# Patient Record
Sex: Female | Born: 1985
Health system: Southern US, Community
[De-identification: ages and names within clinical notes are randomized; demographics above are authoritative.]

## PROBLEM LIST (undated history)

## (undated) HISTORY — PX: CHOLECYSTECTOMY: SHX55

## (undated) HISTORY — PX: TONSILLECTOMY: SUR1361

## (undated) HISTORY — PX: DILATION AND CURETTAGE OF UTERUS: SHX78

---

## 2014-10-18 LAB — HM PAP SMEAR: HM Pap smear: NEGATIVE

## 2017-12-21 DIAGNOSIS — J029 Acute pharyngitis, unspecified: Secondary | ICD-10-CM | POA: Diagnosis not present

## 2017-12-21 DIAGNOSIS — J019 Acute sinusitis, unspecified: Secondary | ICD-10-CM | POA: Diagnosis not present

## 2017-12-26 ENCOUNTER — Encounter: Payer: Self-pay | Admitting: Emergency Medicine

## 2017-12-26 ENCOUNTER — Other Ambulatory Visit: Payer: Self-pay

## 2017-12-26 DIAGNOSIS — G933 Postviral fatigue syndrome: Secondary | ICD-10-CM | POA: Diagnosis not present

## 2017-12-26 DIAGNOSIS — J329 Chronic sinusitis, unspecified: Secondary | ICD-10-CM | POA: Diagnosis not present

## 2017-12-26 DIAGNOSIS — R0981 Nasal congestion: Secondary | ICD-10-CM | POA: Diagnosis not present

## 2017-12-26 DIAGNOSIS — B349 Viral infection, unspecified: Secondary | ICD-10-CM | POA: Insufficient documentation

## 2017-12-26 DIAGNOSIS — J029 Acute pharyngitis, unspecified: Secondary | ICD-10-CM | POA: Diagnosis not present

## 2017-12-26 DIAGNOSIS — R51 Headache: Secondary | ICD-10-CM | POA: Diagnosis not present

## 2017-12-26 MED ORDER — IBUPROFEN 800 MG PO TABS
800.0000 mg | ORAL_TABLET | Freq: Once | ORAL | Status: AC
  Administered 2017-12-26: 800 mg via ORAL

## 2017-12-26 MED ORDER — IBUPROFEN 800 MG PO TABS
ORAL_TABLET | ORAL | Status: AC
  Filled 2017-12-26: qty 1

## 2017-12-26 NOTE — ED Triage Notes (Signed)
Pt says she's been sick for 2 weeks with sinus pressure and sore throat; saw her MD and took steroid as prescribed; pt now c/o headache and pain to the back of her neck since Tuesday; took Doxycycline and antihistamine as prescribed; felt better by Friday but now all symptoms have returned; pt with hoarse voice and sinus congestion; talking in complete coherent sentences

## 2017-12-27 ENCOUNTER — Emergency Department
Admission: EM | Admit: 2017-12-27 | Discharge: 2017-12-27 | Disposition: A | Discharge status: Home / Self Care | Payer: BLUE CROSS/BLUE SHIELD | Attending: Emergency Medicine | Admitting: Emergency Medicine

## 2017-12-27 ENCOUNTER — Emergency Department: Payer: BLUE CROSS/BLUE SHIELD

## 2017-12-27 DIAGNOSIS — G933 Postviral fatigue syndrome: Secondary | ICD-10-CM

## 2017-12-27 DIAGNOSIS — G9331 Postviral fatigue syndrome: Secondary | ICD-10-CM

## 2017-12-27 DIAGNOSIS — J329 Chronic sinusitis, unspecified: Secondary | ICD-10-CM | POA: Diagnosis not present

## 2017-12-27 DIAGNOSIS — R0981 Nasal congestion: Secondary | ICD-10-CM

## 2017-12-27 MED ORDER — IPRATROPIUM BROMIDE 0.06 % NA SOLN: 2.0000 | Freq: Three times a day (TID) | NASAL | 0 refills | Status: DC

## 2017-12-27 MED ORDER — ACETAMINOPHEN 500 MG PO TABS
1000.0000 mg | ORAL_TABLET | Freq: Once | ORAL | Status: AC
  Administered 2017-12-27: 1000 mg via ORAL
  Filled 2017-12-27: qty 2

## 2017-12-27 MED ORDER — BENZONATATE 200 MG PO CAPS: 200.0000 mg | ORAL_CAPSULE | Freq: Four times a day (QID) | ORAL | 0 refills | Status: DC | PRN

## 2017-12-27 MED ORDER — OXYMETAZOLINE HCL 0.05 % NA SOLN
1.0000 | Freq: Once | NASAL | Status: AC
  Administered 2017-12-27: 1 via NASAL
  Filled 2017-12-27: qty 15

## 2017-12-27 MED ORDER — IBUPROFEN 600 MG PO TABS: 600.0000 mg | ORAL_TABLET | Freq: Once | ORAL | Status: DC

## 2017-12-27 NOTE — ED Notes (Signed)
Pt resting in bed; reports headache pain 3/10 after tylenol and Afrin; but when she coughs she still feels like her "head is going to explode";

## 2017-12-27 NOTE — ED Provider Notes (Signed)
[facility] Emergency Department Provider Note  ____________________________________________   First MD Initiated Contact with Patient 12/27/17 0128     (approximate)  I have reviewed the triage vital signs and the nursing notes.   HISTORY  Chief Complaint Headache and Nasal Congestion   HPI Becky KennedyBobbie Shen is a 32 y.o. female who self presents to the emergency department with roughly 2 weeks of sore throat, sinus congestion, and headache.  She initially went to her primary care who gave her several days of steroids which did not seem to help.  For the past several days she has been taking doxycycline and an unknown antihistamine with minimal relief.  She denies fevers or chills.  Her primary concern is the pressure in her sinuses.  It is worse in the morning improve throughout the day.  She has not been using any sinus rinses.  Her headache is nonradiating.  She does have some hoarse voice.  History reviewed. No pertinent past medical history.  There are no active problems to display for this patient.   Past Surgical History:  Procedure Laterality Date  . CESAREAN SECTION    . CHOLECYSTECTOMY    . DILATION AND CURETTAGE OF UTERUS    . TONSILLECTOMY      Prior to Admission medications   Medication Sig Start Date End Date Taking? Authorizing Provider  benzonatate (TESSALON) 200 MG capsule Take 1 capsule (200 mg total) by mouth every 6 (six) hours as needed for cough. 12/27/17 12/27/18  Merrily Brittleifenbark, Angeni Chaudhuri, MD  ipratropium (ATROVENT) 0.06 % nasal spray Place 2 sprays into the nose 3 (three) times daily. 12/27/17 12/27/18  Merrily Brittleifenbark, Altus Zaino, MD    Allergies Amoxicillin and Ceclor [cefaclor]  History reviewed. No pertinent family history.  Social History Social History   Tobacco Use  . Smoking status: Never Smoker  . Smokeless tobacco: Never Used  Substance Use Topics  . Alcohol use: Never    Frequency: Never  . Drug use: Never    Review of  Systems Constitutional: No fever/chills Eyes: No visual changes. ENT: Positive for sore throat. Cardiovascular: Denies chest pain. Respiratory: Denies shortness of breath. Gastrointestinal: No abdominal pain.  No nausea, no vomiting.  No diarrhea.  No constipation. Genitourinary: Negative for dysuria. Musculoskeletal: Negative for back pain. Skin: Negative for rash. Neurological: Positive for headache   ____________________________________________   PHYSICAL EXAM:  VITAL SIGNS: ED Triage Vitals  Enc Vitals Group     BP 12/26/17 2248 (!) 140/94     Pulse Rate 12/26/17 2248 (!) 121     Resp 12/26/17 2248 19     Temp 12/26/17 2248 99.9 F (37.7 C)     Temp Source 12/26/17 2248 Oral     SpO2 12/26/17 2248 96 %     Weight 12/26/17 2250 235 lb (106.6 kg)     Height 12/26/17 2250 5\' 5"  (1.651 m)     Head Circumference --      Peak Flow --      Pain Score 12/26/17 2250 9     Pain Loc --      Pain Edu? --      Excl. in GC? --     Constitutional: Alert and oriented x4 speaks with nasal voice appears somewhat uncomfortable.  Hoarse voice Eyes: PERRL EOMI. Head: Atraumatic. Nose: She does have sinus tenderness and some congestion noted Mouth/Throat: No trismus Neck: No stridor.  No meningismus Cardiovascular: Normal rate, regular rhythm. Grossly normal heart sounds.  Good peripheral circulation.  Respiratory: Normal respiratory effort.  No retractions. Lungs CTAB and moving good air Gastrointestinal: Soft nontender Musculoskeletal: No lower extremity edema   Neurologic:  Normal speech and language. No gross focal neurologic deficits are appreciated. Skin:  Skin is warm, dry and intact. No rash noted. Psychiatric: Mood and affect are normal. Speech and behavior are normal.    ____________________________________________   DIFFERENTIAL includes but not limited to  Sinus congestion, sinusitis, laryngitis, upper respiratory tract infection,  pneumonia ____________________________________________   LABS (all labs ordered are listed, but only abnormal results are displayed)  Labs Reviewed - No data to display   __________________________________________  EKG   ____________________________________________  RADIOLOGY  Chest x-ray reviewed by me with no infiltrate ____________________________________________   PROCEDURES  Procedure(s) performed: no  Procedures  Critical Care performed: no  Observation: no ____________________________________________   INITIAL IMPRESSION / ASSESSMENT AND PLAN / ED COURSE  Pertinent labs & imaging results that were available during my care of the patient were reviewed by me and considered in my medical decision making (see chart for details).  The patient arrives with sinus congestion and a hoarse voice.  She likely has a post viral syndrome along with increased congestion although given the duration of her symptoms chest x-ray obtained to evaluate for possible pneumonia which is fortunately negative.  I reviewed using sinus rinses with the patient.  She feels improved after Afrin.  Will discharge home with Tessalon Perles as well as Atrovent nasal spray.  Strict return precautions given to the patient verbalized understanding agreement plan.      ____________________________________________   FINAL CLINICAL IMPRESSION(S) / ED DIAGNOSES  Final diagnoses:  Sinus congestion  Post viral syndrome      NEW MEDICATIONS STARTED DURING THIS VISIT:  Discharge Medication List as of 12/27/2017  3:33 AM    START taking these medications   Details  benzonatate (TESSALON) 200 MG capsule Take 1 capsule (200 mg total) by mouth every 6 (six) hours as needed for cough., Starting Mon 12/27/2017, Until Tue 12/27/2018, Print    ipratropium (ATROVENT) 0.06 % nasal spray Place 2 sprays into the nose 3 (three) times daily., Starting Mon 12/27/2017, Until Tue 12/27/2018, Print          Note:  This document was prepared using Dragon voice recognition software and may include unintentional dictation errors.     Merrily Brittle, MD 12/29/17 2228

## 2017-12-27 NOTE — ED Notes (Signed)
Pt triaged by this nurse, see note; pt says Ibuprofen only eased her pain slightly; says she feels sweaty, like she broke a fever while waiting in the [facility]

## 2017-12-27 NOTE — Discharge Instructions (Signed)
Fortunately  today your chest x-ray was reassuring.  Please use a Nettie pot or sinus rinse every day to help with your congestion follow-up with primary care as needed.  Return to the emergency department for any concerns.  It was a pleasure to take care of you today, and thank you for coming to our emergency department.  If you have any questions or concerns before leaving please ask the nurse to grab me and I'm more than happy to go through your aftercare instructions again.  If you were prescribed any opioid pain medication today such as Norco, Vicodin, Percocet, morphine, hydrocodone, or oxycodone please make sure you do not drive when you are taking this medication as it can alter your ability to drive safely.  If you have any concerns once you are home that you are not improving or are in fact getting worse before you can make it to your follow-up appointment, please do not hesitate to call 911 and come back for further evaluation.  Merrily Brittle, MD  No results found for this or any previous visit. Dg Chest 2 View  Result Date: 12/27/2017 CLINICAL DATA:  Subacute onset of sinus pressure and sore throat. Headache and neck pain. EXAM: CHEST - 2 VIEW COMPARISON:  None. FINDINGS: The lungs are well-aerated and clear. There is no evidence of focal opacification, pleural effusion or pneumothorax. The heart is normal in size; the mediastinal contour is within normal limits. No acute osseous abnormalities are seen. Clips are noted within the right upper quadrant, reflecting prior cholecystectomy. IMPRESSION: No acute cardiopulmonary process seen. Electronically Signed   By: Roanna Raider M.D.   On: 12/27/2017 02:25

## 2018-01-25 DIAGNOSIS — L508 Other urticaria: Secondary | ICD-10-CM | POA: Diagnosis not present

## 2018-01-25 DIAGNOSIS — L509 Urticaria, unspecified: Secondary | ICD-10-CM | POA: Diagnosis not present

## 2018-01-25 DIAGNOSIS — Z6841 Body Mass Index (BMI) 40.0 and over, adult: Secondary | ICD-10-CM | POA: Diagnosis not present

## 2018-01-27 ENCOUNTER — Emergency Department
Admission: EM | Admit: 2018-01-27 | Discharge: 2018-01-28 | Disposition: A | Discharge status: Home / Self Care | Payer: BLUE CROSS/BLUE SHIELD | Attending: Emergency Medicine | Admitting: Emergency Medicine

## 2018-01-27 DIAGNOSIS — L500 Allergic urticaria: Secondary | ICD-10-CM | POA: Diagnosis not present

## 2018-01-27 DIAGNOSIS — L509 Urticaria, unspecified: Secondary | ICD-10-CM | POA: Insufficient documentation

## 2018-01-27 DIAGNOSIS — Z79899 Other long term (current) drug therapy: Secondary | ICD-10-CM | POA: Diagnosis not present

## 2018-01-27 LAB — BASIC METABOLIC PANEL
Anion gap: 8 (ref 5–15)
BUN: 14 mg/dL (ref 6–20)
CHLORIDE: 103 mmol/L (ref 101–111)
CO2: 28 mmol/L (ref 22–32)
Calcium: 9.3 mg/dL (ref 8.9–10.3)
Creatinine, Ser: 0.76 mg/dL (ref 0.44–1.00)
GFR calc non Af Amer: >60 mL/min (ref >60)
Glucose, Bld: 122 mg/dL — ABNORMAL HIGH (ref 65–99)
POTASSIUM: 3.9 mmol/L (ref 3.5–5.1)
Sodium: 139 mmol/L (ref 135–145)

## 2018-01-27 LAB — CBC WITH DIFFERENTIAL/PLATELET
Basophils Absolute: 0.1 10*3/uL (ref 0–0.1)
Basophils Relative: 1 %
EOS ABS: 0.2 10*3/uL (ref 0–0.7)
Eosinophils Relative: 2 %
HCT: 41 % (ref 35.0–47.0)
HEMOGLOBIN: 13.7 g/dL (ref 12.0–16.0)
LYMPHS ABS: 3.8 10*3/uL — AB (ref 1.0–3.6)
LYMPHS PCT: 34 %
MCH: 28.6 pg (ref 26.0–34.0)
MCHC: 33.5 g/dL (ref 32.0–36.0)
MCV: 85.2 fL (ref 80.0–100.0)
Monocytes Absolute: 0.6 10*3/uL (ref 0.2–0.9)
Monocytes Relative: 5 %
NEUTROS ABS: 6.6 10*3/uL — AB (ref 1.4–6.5)
NEUTROS PCT: 58 %
Platelets: 226 10*3/uL (ref 150–440)
RBC: 4.82 MIL/uL (ref 3.80–5.20)
RDW: 14.5 % (ref 11.5–14.5)
WBC: 11.3 10*3/uL — AB (ref 3.6–11.0)

## 2018-01-27 LAB — HEPATIC FUNCTION PANEL
ALK PHOS: 73 U/L (ref 38–126)
ALT: 16 U/L (ref 14–54)
AST: 22 U/L (ref 15–41)
Albumin: 3.9 g/dL (ref 3.5–5.0)
BILIRUBIN INDIRECT: 0.6 mg/dL (ref 0.3–0.9)
Bilirubin, Direct: 0.1 mg/dL (ref 0.1–0.5)
TOTAL PROTEIN: 7.2 g/dL (ref 6.5–8.1)
Total Bilirubin: 0.7 mg/dL (ref 0.3–1.2)

## 2018-01-27 LAB — SEDIMENTATION RATE: Sed Rate: 29 mm/hr — ABNORMAL HIGH (ref 0–20)

## 2018-01-27 MED ORDER — DIPHENHYDRAMINE HCL 50 MG/ML IJ SOLN
25.0000 mg | Freq: Once | INTRAMUSCULAR | Status: AC
  Administered 2018-01-27: 25 mg via INTRAVENOUS
  Filled 2018-01-27: qty 1

## 2018-01-27 MED ORDER — FAMOTIDINE IN NACL 20-0.9 MG/50ML-% IV SOLN
20.0000 mg | Freq: Once | INTRAVENOUS | Status: AC
  Administered 2018-01-27: 20 mg via INTRAVENOUS
  Filled 2018-01-27: qty 50

## 2018-01-27 MED ORDER — METHYLPREDNISOLONE SODIUM SUCC 125 MG IJ SOLR
125.0000 mg | Freq: Once | INTRAMUSCULAR | Status: AC
  Administered 2018-01-27: 125 mg via INTRAVENOUS
  Filled 2018-01-27: qty 2

## 2018-01-27 NOTE — ED Triage Notes (Signed)
Patient c/o oral swelling and throat tightness. Patient reports intermittent hives since April.

## 2018-01-28 LAB — URINALYSIS, COMPLETE (UACMP) WITH MICROSCOPIC
Bilirubin Urine: NEGATIVE
Glucose, UA: NEGATIVE mg/dL
Hgb urine dipstick: NEGATIVE
KETONES UR: NEGATIVE mg/dL
Leukocytes, UA: NEGATIVE
Nitrite: NEGATIVE
PROTEIN: NEGATIVE mg/dL
Specific Gravity, Urine: 1.011 (ref 1.005–1.030)
pH: 7 (ref 5.0–8.0)

## 2018-01-28 MED ORDER — DIPHENHYDRAMINE HCL 25 MG PO TABS: 25.0000 mg | ORAL_TABLET | Freq: Four times a day (QID) | ORAL | 0 refills | Status: DC | PRN

## 2018-01-28 MED ORDER — PREDNISONE 10 MG PO TABS: ORAL_TABLET | ORAL | 0 refills | Status: AC

## 2018-01-28 MED ORDER — RANITIDINE HCL 150 MG PO CAPS: 150.0000 mg | ORAL_CAPSULE | Freq: Two times a day (BID) | ORAL | 0 refills | Status: DC

## 2018-01-28 NOTE — Discharge Instructions (Addendum)
Your blood tests today were unremarkable. Keep your appointments with primary care and the allergy specialist.

## 2018-01-28 NOTE — ED Provider Notes (Signed)
[facility] Emergency Department Provider Note  ____________________________________________  Time seen: Approximately 12:04 AM  I have reviewed the triage vital signs and the nursing notes.   HISTORY  Chief Complaint Allergic Reaction    HPI Becky Welch is a 32 y.o. female Who complains of chronic urticaria for the past 8 weeks. She seemed primary care for this, it gets better with steroids and antihistamines, but worsens on a daily basis in between dosing. Worsens when she is in between courses of steroids. No fevers or chills. No tick exposures. No headaches or neck pain or stiffness or body aches. No difficulty breathing or throat swelling or vomiting.  Has follow-up with primary care and immunology in about 2 weeks      History reviewed. No pertinent past medical history.   There are no active problems to display for this patient.    Past Surgical History:  Procedure Laterality Date  . CESAREAN SECTION    . CHOLECYSTECTOMY    . DILATION AND CURETTAGE OF UTERUS    . TONSILLECTOMY       Prior to Admission medications   Medication Sig Start Date End Date Taking? Authorizing Provider  benzonatate (TESSALON) 200 MG capsule Take 1 capsule (200 mg total) by mouth every 6 (six) hours as needed for cough. 12/27/17 12/27/18  Merrily Brittle, MD  diphenhydrAMINE (BENADRYL) 25 MG tablet Take 1 tablet (25 mg total) by mouth every 6 (six) hours as needed. 01/28/18   Sharman Cheek, MD  ipratropium (ATROVENT) 0.06 % nasal spray Place 2 sprays into the nose 3 (three) times daily. 12/27/17 12/27/18  Merrily Brittle, MD  predniSONE (DELTASONE) 10 MG tablet Take 6 tablets (60 mg total) by mouth daily for 3 days, THEN 4 tablets (40 mg total) daily for 3 days, THEN 2 tablets (20 mg total) daily for 3 days, THEN 1 tablet (10 mg total) daily for 3 days. 01/28/18 02/09/18  Sharman Cheek, MD  ranitidine (ZANTAC) 150 MG capsule Take 1 capsule (150 mg total) by mouth 2  (two) times daily. 01/28/18   Sharman Cheek, MD     Allergies Amoxicillin and Ceclor [cefaclor]   No family history on file.  Social History Social History   Tobacco Use  . Smoking status: Never Smoker  . Smokeless tobacco: Never Used  Substance Use Topics  . Alcohol use: Never    Frequency: Never  . Drug use: Never    Review of Systems  Constitutional:   No fever or chills.  ENT:   No sore throat. No rhinorrhea. Cardiovascular:   No chest pain or syncope. Respiratory:   No dyspnea or cough. Gastrointestinal:   Negative for abdominal pain, vomiting and diarrhea.  Musculoskeletal:   Negative for focal pain or swelling All other systems reviewed and are negative except as documented above in ROS and HPI.  ____________________________________________   PHYSICAL EXAM:  VITAL SIGNS: ED Triage Vitals  Enc Vitals Group     BP 01/27/18 1959 (!) 155/109     Pulse Rate 01/27/18 1959 (!) 102     Resp 01/27/18 1959 (!) 22     Temp 01/27/18 1959 98.1 F (36.7 C)     Temp Source 01/27/18 1959 Oral     SpO2 01/27/18 1959 97 %     Weight 01/27/18 2000 230 lb (104.3 kg)     Height --      Head Circumference --      Peak Flow --      Pain  Score 01/27/18 2000 2     Pain Loc --      Pain Edu? --      Excl. in GC? --     Vital signs reviewed, nursing assessments reviewed.   Constitutional:   Alert and oriented. Well appearing and in no distress. Eyes:   Conjunctivae are normal. EOMI. PERRL. ENT      Head:   Normocephalic and atraumatic.      Nose:   No congestion/rhinnorhea.       Mouth/Throat:   MMM, no pharyngeal erythema. No peritonsillar mass.       Neck:   No meningismus. Full ROM. Hematological/Lymphatic/Immunilogical:   No cervical lymphadenopathy. Cardiovascular:   RRR. Symmetric bilateral radial and DP pulses.  No murmurs.  Respiratory:   Normal respiratory effort without tachypnea/retractions. Breath sounds are clear and equal bilaterally. No  wheezes/rales/rhonchi.no inducible wheezing with FEV1 maneuver Gastrointestinal:   Soft and nontender. Non distended. There is no CVA tenderness.  No rebound, rigidity, or guarding.  Musculoskeletal:   Normal range of motion in all extremities. No joint effusions.  No lower extremity tenderness.  No edema. Neurologic:   Normal speech and language.  Motor grossly intact. No acute focal neurologic deficits are appreciated.  Skin:    Skin is warm, dry and intact. No rash noted.  No petechiae, purpura, or bullae.  ____________________________________________    LABS (pertinent positives/negatives) (all labs ordered are listed, but only abnormal results are displayed) Labs Reviewed  BASIC METABOLIC PANEL - Abnormal; Notable for the following components:      Result Value   Glucose, Bld 122 (*)    All other components within normal limits  CBC WITH DIFFERENTIAL/PLATELET - Abnormal; Notable for the following components:   WBC 11.3 (*)    Neutro Abs 6.6 (*)    Lymphs Abs 3.8 (*)    All other components within normal limits  SEDIMENTATION RATE - Abnormal; Notable for the following components:   Sed Rate 29 (*)    All other components within normal limits  HEPATIC FUNCTION PANEL  URINALYSIS, COMPLETE (UACMP) WITH MICROSCOPIC   ____________________________________________   EKG    ____________________________________________    RADIOLOGY  No results found.  ____________________________________________   PROCEDURES Procedures  ____________________________________________   CLINICAL IMPRESSION / ASSESSMENT AND PLAN / ED COURSE  Pertinent labs & imaging results that were available during my care of the patient were reviewed by me and considered in my medical decision making (see chart for details).    patient presents with chronic urticaria. No identifiable cause. Artery completed courses of azithromycin and doxycycline, and no known tick exposure or other chemical  exposure. She is calm and comfortable, not in anaphylaxis. Treat with steroids and antihistamines, continue follow-up as scheduled. Check labs for signs of inflammatory state, polycythemia, uremia  Clinical Course as of Jan 29 3  Thu Jan 27, 2018  2358 labs reassuring, no evidence of significant underlying pathology as a cause of the urticaria. We'll continue to treat symptomatically until she can follow up with allergy and immunology as scheduled in the next 2 weeks.   [PS]    Clinical Course User Index [PS] Sharman Cheek, MD     ____________________________________________   FINAL CLINICAL IMPRESSION(S) / ED DIAGNOSES    Final diagnoses:  Urticaria     ED Discharge Orders        Ordered    diphenhydrAMINE (BENADRYL) 25 MG tablet  Every 6 hours PRN     01/28/18 0003  predniSONE (DELTASONE) 10 MG tablet     01/28/18 0003    ranitidine (ZANTAC) 150 MG capsule  2 times daily     01/28/18 0003      Portions of this note were generated with dragon dictation software. Dictation errors may occur despite best attempts at proofreading.    Sharman Cheek, MD 01/28/18 Rich Fuchs

## 2018-02-02 DIAGNOSIS — L509 Urticaria, unspecified: Secondary | ICD-10-CM | POA: Insufficient documentation

## 2018-02-10 DIAGNOSIS — L501 Idiopathic urticaria: Secondary | ICD-10-CM | POA: Insufficient documentation

## 2018-02-10 DIAGNOSIS — J301 Allergic rhinitis due to pollen: Secondary | ICD-10-CM | POA: Diagnosis not present

## 2018-02-10 DIAGNOSIS — L508 Other urticaria: Secondary | ICD-10-CM | POA: Diagnosis not present

## 2018-02-10 DIAGNOSIS — T783XXA Angioneurotic edema, initial encounter: Secondary | ICD-10-CM | POA: Diagnosis not present

## 2018-02-23 ENCOUNTER — Encounter: Payer: Self-pay | Admitting: Physician Assistant

## 2018-02-23 ENCOUNTER — Ambulatory Visit (INDEPENDENT_AMBULATORY_CARE_PROVIDER_SITE_OTHER): Payer: BLUE CROSS/BLUE SHIELD | Admitting: Physician Assistant

## 2018-02-23 VITALS — BP 118/86 | HR 72 | Temp 98.2°F | Resp 16 | Ht 65.0 in | Wt 252.0 lb

## 2018-02-23 DIAGNOSIS — Z13220 Encounter for screening for lipoid disorders: Secondary | ICD-10-CM | POA: Diagnosis not present

## 2018-02-23 DIAGNOSIS — D72829 Elevated white blood cell count, unspecified: Secondary | ICD-10-CM

## 2018-02-23 DIAGNOSIS — Z114 Encounter for screening for human immunodeficiency virus [HIV]: Secondary | ICD-10-CM | POA: Diagnosis not present

## 2018-02-23 DIAGNOSIS — L509 Urticaria, unspecified: Secondary | ICD-10-CM | POA: Diagnosis not present

## 2018-02-23 DIAGNOSIS — R739 Hyperglycemia, unspecified: Secondary | ICD-10-CM | POA: Diagnosis not present

## 2018-02-23 NOTE — Patient Instructions (Signed)

## 2018-02-23 NOTE — Progress Notes (Signed)
Patient: Becky Welch Female    DOB: 02/02/1986   32 y.o.   MRN: [id number] Visit Date: 02/23/2018  Today's Provider: Trey Sailors, PA-C   Chief Complaint  Patient presents with  . Establish Care   Subjective:    HPI  Originally from [place], [place]. 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 Husband works in Research scientist (medical).  Had abnormal PAP when 14, biopsy revealed "mild" dysplasia. She has not had abnormal since. Had last [place] in <[place] years [place]. Thinks she is due in the fall. Will need to request records.  Has been seeing allergist for recurrent hives, on treatment with Zyrtec, Singulair, Zantac and hydroxyzine.   Tonsillectomy in 2017.     Allergies  Allergen Reactions  . Doxepin Swelling  . Amoxicillin Other (See Comments)  . Ceclor [Cefaclor] Other (See Comments)     Current Outpatient Medications:  .  cetirizine (ZYRTEC) 10 MG tablet, Take 4 tablets by mouth., Disp: , Rfl:  .  EPINEPHrine 0.3 mg/0.3 mL IJ SOAJ injection, Inject into the muscle., Disp: , Rfl:  .  fluticasone (FLONASE) 50 MCG/ACT nasal spray, 2 sprays by Each Nare route daily., Disp: , Rfl:  .  hydrOXYzine (VISTARIL) 25 MG capsule, Take by mouth., Disp: , Rfl:  .  montelukast (SINGULAIR) 10 MG tablet, Take by mouth., Disp: , Rfl:  .  ranitidine (ZANTAC) 150 MG capsule, Take 1 capsule (150 mg total) by mouth 2 (two) times daily., Disp: 28 capsule, Rfl: 0 .  ranitidine (ZANTAC) 150 MG tablet, Take by mouth., Disp: , Rfl:  .  benzonatate (TESSALON) 200 MG capsule, Take 1 capsule (200 mg total) by mouth every 6 (six) hours as needed for cough., Disp: 30 capsule, Rfl: 0 .  diphenhydrAMINE (BENADRYL) 25 MG tablet, Take 1 tablet (25 mg total) by mouth every 6 (six) hours as needed., Disp: 30 tablet, Rfl: 0 .  ipratropium (ATROVENT) 0.06 % nasal spray, Place 2 sprays into the nose 3 (three) times daily., Disp: 15 mL, Rfl: 0  Review of Systems  Constitutional: Negative.   HENT: Positive for  facial swelling and trouble swallowing. Negative for congestion, dental problem, drooling, ear discharge, ear pain, hearing loss, mouth sores, nosebleeds, postnasal drip, rhinorrhea, sinus pressure, sinus pain, sneezing, sore throat, tinnitus and voice change.   Eyes: Negative.   Respiratory: Negative.   Cardiovascular: Negative.   Gastrointestinal: Negative.   Endocrine: Negative.   Genitourinary: Negative.   Musculoskeletal: Negative.   Skin: Positive for color change and rash. Negative for pallor and wound.  Allergic/Immunologic: Negative.   Neurological: Negative.   Hematological: Negative.   Psychiatric/Behavioral: Negative.     Social History   Tobacco Use  . Smoking status: Never Smoker  . Smokeless tobacco: Never Used  Substance Use Topics  . Alcohol use: Never    Frequency: Never    Comment: Maybe once a month   Objective:   BP 118/86 (BP Location: Right Arm, Patient Position: Sitting, Cuff Size: Large)   Pulse 72   Temp 98.2 F (36.8 C) (Oral)   Resp 16   Ht 5\' 5"  (1.651 m)   Wt 252 lb (114.3 kg)   BMI 41.93 kg/m  Vitals:   02/23/18 1027  BP: 118/86  Pulse: 72  Resp: 16  Temp: 98.2 F (36.8 C)  TempSrc: Oral  Weight: 252 lb (114.3 kg)  Height: 5\' 5"  (1.651 m)     Physical Exam  Constitutional: She is oriented to  person, place, and time. She appears well-developed and well-nourished.  Cardiovascular: Normal rate and regular rhythm.  Pulmonary/Chest: Effort normal and breath sounds normal.  Neurological: She is alert and oriented to person, place, and time.  Skin: Skin is warm and dry.  Psychiatric: She has a normal mood and affect. Her behavior is normal.        Assessment & Plan:     1. Urticaria  [facility]  2. Lipid screening  - Lipid Profile  3. Encounter for screening for HIV  - HIV antibody (with reflex)  4. Hyperglycemia  - HgB A1c  5. Leukocytosis, unspecified type  - CBC With Differential  Return in  about 4 months (around 06/25/2018) for PAP .  The entirety of the information documented in the History of Present Illness, Review of Systems and Physical Exam were personally obtained by me. Portions of this information were initially documented by Kavin LeechLaura Walsh, CMA and reviewed by me for thoroughness and accuracy.         Trey SailorsAdriana M Alyn Jurney, PA-C  [facility]

## 2018-02-24 ENCOUNTER — Telehealth: Payer: Self-pay

## 2018-02-24 LAB — LIPID PANEL
Chol/HDL Ratio: 4.5 ratio — ABNORMAL HIGH (ref 0.0–4.4)
Cholesterol, Total: 181 mg/dL (ref 100–199)
HDL: 40 mg/dL (ref >39)
LDL Calculated: 117 mg/dL — ABNORMAL HIGH (ref 0–99)
Triglycerides: 121 mg/dL (ref 0–149)
VLDL Cholesterol Cal: 24 mg/dL (ref 5–40)

## 2018-02-24 LAB — CBC WITH DIFFERENTIAL
Basophils Absolute: 0 10*3/uL (ref 0.0–0.2)
Basos: 0 %
EOS (ABSOLUTE): 0.1 10*3/uL (ref 0.0–0.4)
Eos: 2 %
Hematocrit: 39.5 % (ref 34.0–46.6)
Hemoglobin: 13.2 g/dL (ref 11.1–15.9)
Immature Grans (Abs): 0 10*3/uL (ref 0.0–0.1)
Immature Granulocytes: 1 %
Lymphocytes Absolute: 2.2 10*3/uL (ref 0.7–3.1)
Lymphs: 38 %
MCH: 28.6 pg (ref 26.6–33.0)
MCHC: 33.4 g/dL (ref 31.5–35.7)
MCV: 86 fL (ref 79–97)
Monocytes Absolute: 0.4 10*3/uL (ref 0.1–0.9)
Monocytes: 7 %
Neutrophils Absolute: 3.1 10*3/uL (ref 1.4–7.0)
Neutrophils: 52 %
RBC: 4.61 x10E6/uL (ref 3.77–5.28)
RDW: 15.1 % (ref 12.3–15.4)
WBC: 5.9 10*3/uL (ref 3.4–10.8)

## 2018-02-24 LAB — HEMOGLOBIN A1C
Est. average glucose Bld gHb Est-mCnc: 114 mg/dL
Hgb A1c MFr Bld: 5.6 % (ref 4.8–5.6)

## 2018-02-24 LAB — HIV ANTIBODY (ROUTINE TESTING W REFLEX): HIV Screen 4th Generation wRfx: NONREACTIVE

## 2018-02-24 NOTE — Telephone Encounter (Signed)
Pt advised.   Thanks,   -Demarqus Jocson  

## 2018-02-24 NOTE — Telephone Encounter (Signed)
-----   Message from Trey SailorsAdriana M Pollak, [place]PA-C sent at 02/24/2018  8:16 AM EDT ----- LDL slightly elevated but overall cholesterol normal. Remaining labwork normal.

## 2018-03-03 ENCOUNTER — Encounter: Payer: Self-pay | Admitting: Physician Assistant

## 2018-03-03 DIAGNOSIS — L508 Other urticaria: Secondary | ICD-10-CM | POA: Diagnosis not present

## 2018-03-03 DIAGNOSIS — L501 Idiopathic urticaria: Secondary | ICD-10-CM | POA: Diagnosis not present

## 2018-05-20 ENCOUNTER — Encounter: Payer: Self-pay | Admitting: Family Medicine

## 2018-05-20 ENCOUNTER — Ambulatory Visit (INDEPENDENT_AMBULATORY_CARE_PROVIDER_SITE_OTHER): Payer: BLUE CROSS/BLUE SHIELD | Admitting: Family Medicine

## 2018-05-20 VITALS — BP 102/70 | HR 64 | Temp 97.5°F | Resp 16 | Wt 249.4 lb

## 2018-05-20 DIAGNOSIS — J0100 Acute maxillary sinusitis, unspecified: Secondary | ICD-10-CM

## 2018-05-20 MED ORDER — DOXYCYCLINE HYCLATE 100 MG PO TABS: 100.0000 mg | ORAL_TABLET | Freq: Two times a day (BID) | ORAL | 0 refills | Status: AC

## 2018-05-20 NOTE — Progress Notes (Signed)
  Subjective:     Patient ID: Becky KennedyBobbie Welch, female   DOB: 05/24/1986, 32 y.o.   MRN: [id number] Chief Complaint  Patient presents with  . Sinus Problem    Patient comes in office today with complaints of sinus pain and pressure below her eyes and dry cough for 4 weeks. Patient states that she hears a rattle in her chest when cough and has ear pain on the right side that has been intermittent. Patient has taken otc Dayquil and Xyzal.    HPI States she developed a non-productive "tickle" cough 3-4 weeks ago then in the last 3 days has had increased sinus pressure with purulent sinus drainage. She is recovering from chronic urticaria which is currently controlled by Xyzal 5 mg. Daily.  Review of Systems     Objective:   Physical Exam  Constitutional: She appears well-developed and well-nourished. No distress.  Ears: T.M's intact without inflammation Sinuses: mild maxillary sinus tenderness Throat: tonsils absent without erythema Neck: Left anterior cervical nodes. Lungs: clear     Assessment:    1. Acute non-recurrent maxillary sinusitis - doxycycline (VIBRA-TABS) 100 MG tablet; Take 1 tablet (100 mg total) by mouth 2 (two) times daily.  Dispense: 20 tablet; Refill: 0    Plan:    Discussed use of Mucinex D and Delsym.

## 2018-05-20 NOTE — Patient Instructions (Addendum)
Discussed use of Mucinex D for congestion and Delsym for cough. Let us know if not improving over the next several days.

## 2018-06-15 ENCOUNTER — Encounter: Payer: BLUE CROSS/BLUE SHIELD | Admitting: Physician Assistant

## 2018-08-25 DIAGNOSIS — Z6841 Body Mass Index (BMI) 40.0 and over, adult: Secondary | ICD-10-CM | POA: Diagnosis not present

## 2018-08-25 DIAGNOSIS — L501 Idiopathic urticaria: Secondary | ICD-10-CM | POA: Diagnosis not present

## 2018-11-25 DIAGNOSIS — L501 Idiopathic urticaria: Secondary | ICD-10-CM | POA: Diagnosis not present

## 2018-11-26 DIAGNOSIS — L501 Idiopathic urticaria: Secondary | ICD-10-CM | POA: Diagnosis not present

## 2019-02-19 IMAGING — CR DG CHEST 2V
1 series · 2 of 2 positions shown · non-contrast
Comparison: None.

CLINICAL DATA: Subacute onset of sinus pressure and sore throat.
Headache and neck pain.

EXAM:
CHEST - 2 VIEW

[Series 1: dg chest 2 view · 0.14mm/px · 2 of 2 slices shown]
[im 1/2]
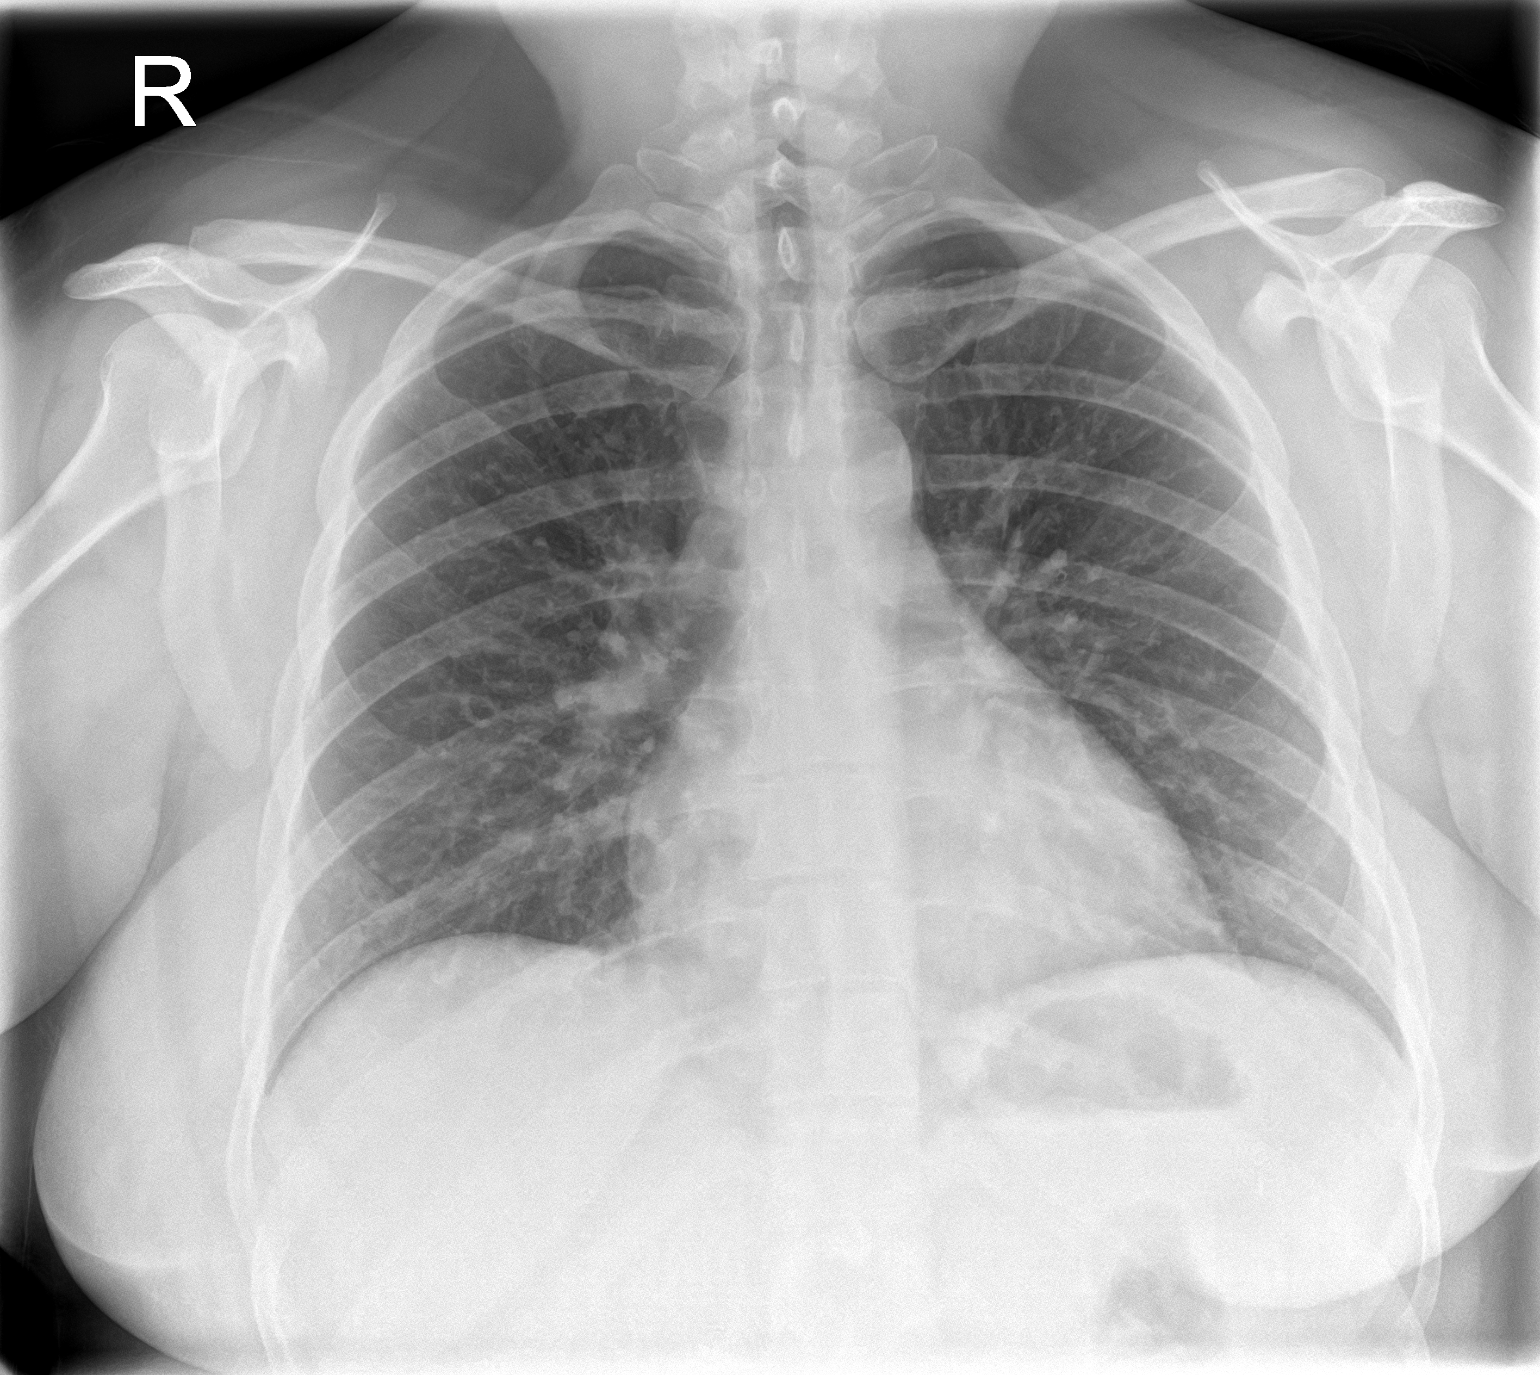
[im 2/2]
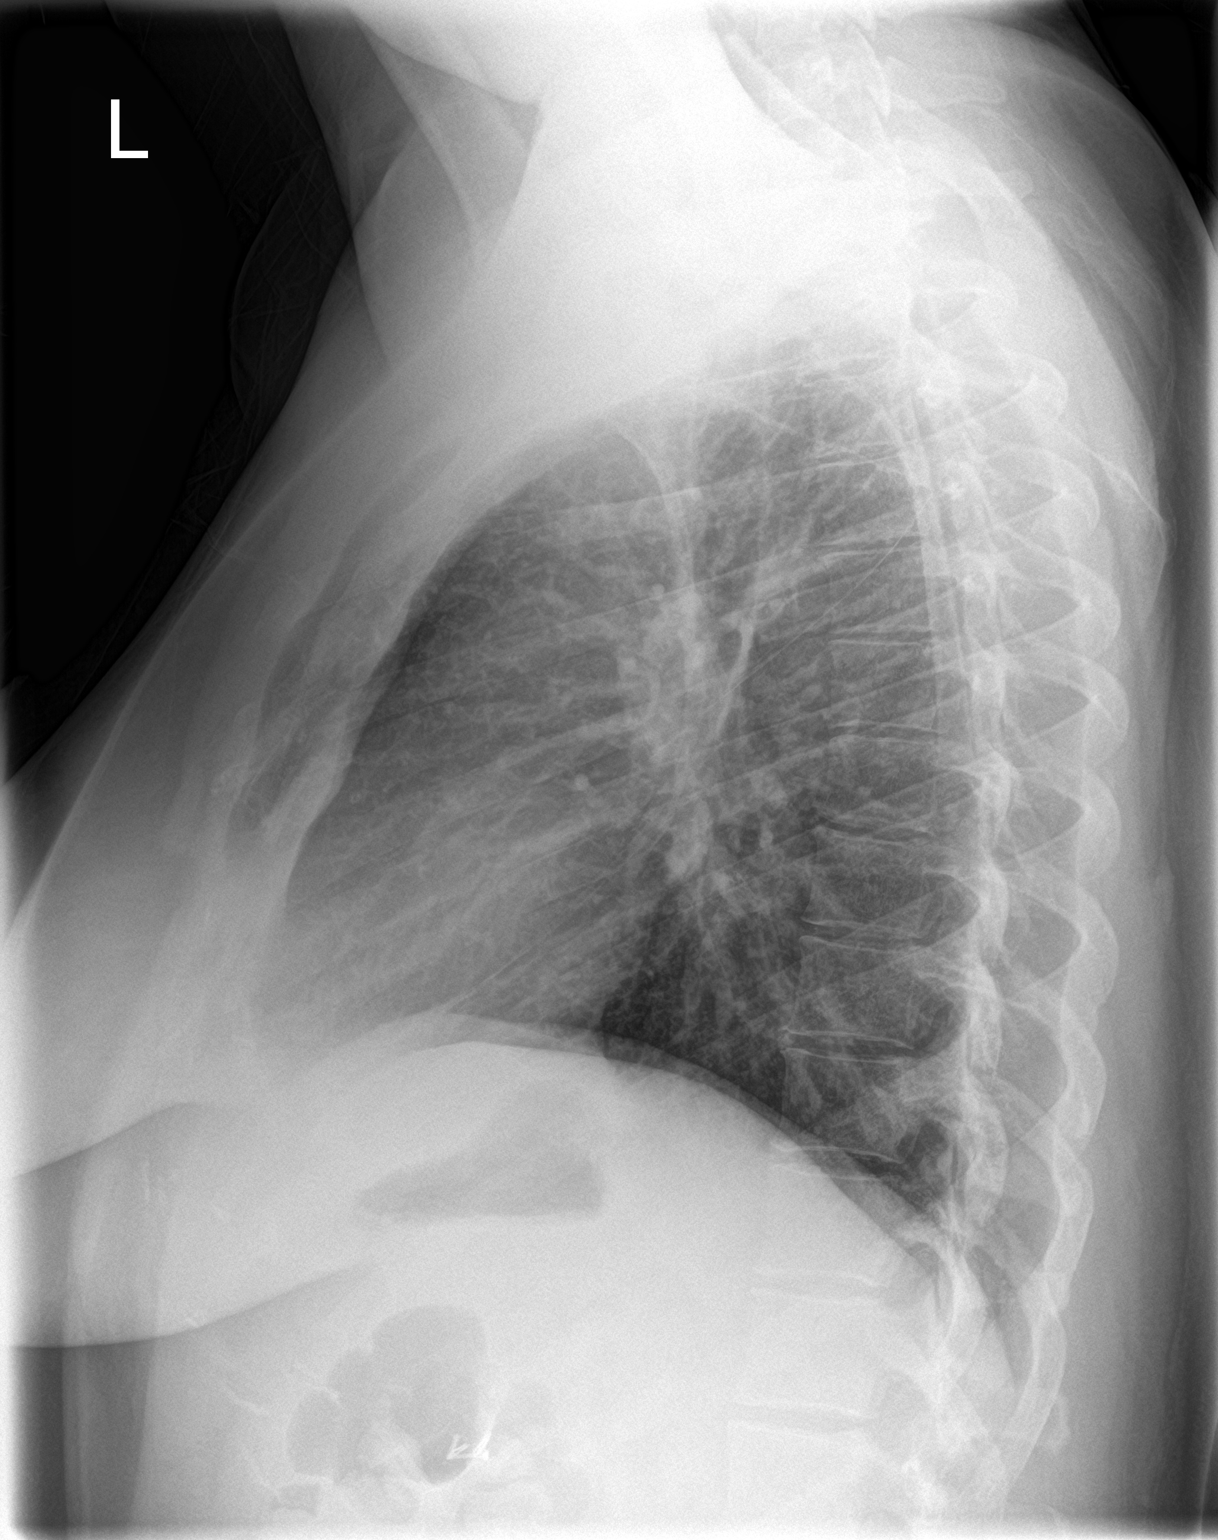

[2 of 2 positions shown; findings below may reference images not displayed]

FINDINGS: The lungs are well-aerated and clear. There is no evidence of focal
opacification, pleural effusion or pneumothorax.

The heart is normal in size; the mediastinal contour is within
normal limits. No acute osseous abnormalities are seen. Clips are
noted within the right upper quadrant, reflecting prior
cholecystectomy.
IMPRESSION: No acute cardiopulmonary process seen.
